# Patient Record
Sex: Female | Born: 2005 | Race: White | Hispanic: No | Marital: Single | Smoking: Never smoker
Health system: Southern US, Community
[De-identification: ages and names within clinical notes are randomized; demographics above are authoritative.]

## PROBLEM LIST (undated history)

## (undated) DIAGNOSIS — T7840XA Allergy, unspecified, initial encounter: Secondary | ICD-10-CM

## (undated) DIAGNOSIS — L709 Acne, unspecified: Secondary | ICD-10-CM

## (undated) DIAGNOSIS — F419 Anxiety disorder, unspecified: Secondary | ICD-10-CM

## (undated) DIAGNOSIS — I10 Essential (primary) hypertension: Secondary | ICD-10-CM

## (undated) HISTORY — DX: Essential (primary) hypertension: I10

## (undated) HISTORY — DX: Acne, unspecified: L70.9

## (undated) HISTORY — DX: Allergy, unspecified, initial encounter: T78.40XA

## (undated) HISTORY — PX: TONSILLECTOMY: SUR1361

## (undated) HISTORY — DX: Anxiety disorder, unspecified: F41.9

---
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
## 2024-01-26 ENCOUNTER — Other Ambulatory Visit: Payer: Self-pay | Admitting: Obstetrics and Gynecology

## 2024-01-26 DIAGNOSIS — Z3041 Encounter for surveillance of contraceptive pills: Secondary | ICD-10-CM

## 2024-01-30 ENCOUNTER — Other Ambulatory Visit: Payer: Self-pay

## 2024-01-30 MED ORDER — DROSPIRENONE-ETHINYL ESTRADIOL 3-0.02 MG PO TABS
1.0000 | ORAL_TABLET | Freq: Every day | ORAL | 0 refills | Status: DC
Start: 2024-01-30 — End: 2024-02-24

## 2024-01-30 NOTE — Telephone Encounter (Signed)
 Patient is scheduled for annual 02/24/24. Requesting refill.  Advised will send refill.

## 2024-01-30 NOTE — Addendum Note (Signed)
 Addended by: Kathlene Cote on: 01/30/2024 01:46 PM   Modules accepted: Orders, Level of Service

## 2024-01-30 NOTE — Telephone Encounter (Signed)
 This encounter was created in error - please disregard.

## 2024-01-30 NOTE — Addendum Note (Signed)
 Addended by: Kathlene Cote on: 01/30/2024 01:46 PM   Modules accepted: Orders

## 2024-02-22 NOTE — Progress Notes (Unsigned)
 PCP:  Rica Records, PA-C   No chief complaint on file.    HPI:      [name] is a 18 y.o. G0P0000 whose LMP was No LMP recorded. (Menstrual status: Oral contraceptives)., presents today for her annual examination.  Her menses are absent with cont dosing OCPs. Only has 1 day BTB if misses a pill. No dysmen.   Sex activity: not currently, contraception - OCP (estrogen/progesterone). Septated hymen removed with Dr. Valentino Saxon 7/24. Last Pap: N/A due to age Hx of STDs: none  There is a FH of breast cancer in her PGM, genetic testing not indicated. There is no FH of ovarian cancer. The patient does not do self-breast exams. Pt with RT??? 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 Had stable 6 mo f/u 10/24, repeat due in 6 monhts   Tobacco use: The patient denies current or previous tobacco use. Alcohol use: none No drug use.  Exercise: moderately active  She does get adequate calcium but not Vitamin D in her diet.  Past Medical History:  Diagnosis Date   Acne    Allergies     Past Surgical History:  Procedure Laterality Date   TONSILLECTOMY      Family History  Problem Relation Age of Onset   Breast cancer Paternal Grandmother 9   Ovarian cancer Neg Hx     Social History   Socioeconomic History   Marital status: Single    Spouse name: Not on file   Number of children: Not on file   Years of education: Not on file   Highest education level: Not on file  Occupational History   Not on file  Tobacco Use   Smoking status: Never   Smokeless tobacco: Never  Vaping Use   Vaping status: Never Used  Substance and Sexual Activity   Alcohol use: Never   Drug use: Never   Sexual activity: Not Currently    Birth control/protection: Pill  Other Topics Concern   Not on file  Social History Narrative   Not on file   Social Drivers of Health   Financial Resource Strain: Not on file  Food Insecurity: Not on file  Transportation Needs: Not on  file  Physical Activity: Not on file  Stress: Not on file  Social Connections: Not on file  Intimate Partner Violence: Not on file     Current Outpatient Medications:    ALPRAZolam (XANAX) 0.5 MG tablet, Take 1 tablet (0.5 mg total) by mouth at bedtime as needed for anxiety. Take 1 tablet 30 minutes prior to procedure.  If still symptomatic, take second dose at time of procedure., Disp: 2 tablet, Rfl: 0   ASTEPRO 205.5 MCG/SPRAY SOLN, USE 2 SPRAYS IN EACH NOSTRIL TWICE DAILY AS NEEDED FOR ALLERGIES, Disp: , Rfl:    cetirizine (ZYRTEC) 10 MG tablet, Take 10 mg by mouth daily., Disp: , Rfl:    drospirenone-ethinyl estradiol (YAZ) 3-0.02 MG tablet, Take 1 tablet by mouth daily. CONTINUOUS DOSING, Disp: 28 tablet, Rfl: 0   fluticasone (FLONASE) 50 MCG/ACT nasal spray, Place 2 sprays into both nostrils daily., Disp: , Rfl:      ROS:  Review of Systems  Constitutional:  Negative for fatigue, fever and unexpected weight change.  Respiratory:  Negative for cough, shortness of breath and wheezing.   Cardiovascular:  Negative for chest pain, palpitations and leg swelling.  Gastrointestinal:  Negative for blood in stool, constipation, diarrhea, nausea and vomiting.  Endocrine:  Negative for cold intolerance, heat intolerance and polyuria.  Genitourinary:  Negative for dyspareunia, dysuria, flank pain, frequency, genital sores, hematuria, menstrual problem, pelvic pain, urgency, vaginal bleeding, vaginal discharge and vaginal pain.  Musculoskeletal:  Negative for back pain, joint swelling and myalgias.  Skin:  Negative for rash.  Neurological:  Negative for dizziness, syncope, light-headedness, numbness and headaches.  Hematological:  Negative for adenopathy.  Psychiatric/Behavioral:  Negative for agitation, confusion, sleep disturbance and suicidal ideas. The patient is not nervous/anxious.    BREAST: No symptoms   Objective: There were no vitals taken for this visit.   Physical  Exam Constitutional:      Appearance: She is well-developed.  Genitourinary:     Vulva normal.     Right Labia: No rash, tenderness or lesions.    Left Labia: No tenderness, lesions or rash.    No vaginal discharge, erythema or tenderness.      Right Adnexa: not tender and no mass present.    Left Adnexa: not tender and no mass present.    No cervical friability or polyp.     Uterus is not enlarged or tender.  Breasts:    Right: Mass present. No nipple discharge, skin change or tenderness.     Left: No mass, nipple discharge, skin change or tenderness.  Neck:     Thyroid: No thyromegaly.  Cardiovascular:     Rate and Rhythm: Normal rate and regular rhythm.     Heart sounds: Normal heart sounds. No murmur heard. Pulmonary:     Effort: Pulmonary effort is normal.     Breath sounds: Normal breath sounds.  Chest:    Abdominal:     Palpations: Abdomen is soft.     Tenderness: There is no abdominal tenderness. There is no guarding or rebound.  Musculoskeletal:        General: Normal range of motion.     Cervical back: Normal range of motion.  Lymphadenopathy:     Cervical: No cervical adenopathy.  Neurological:     General: No focal deficit present.     Mental Status: She is alert and oriented to person, place, and time.     Cranial Nerves: No cranial nerve deficit.  Skin:    General: Skin is warm and dry.  Psychiatric:        Mood and Affect: Mood normal.        Behavior: Behavior normal.        Thought Content: Thought content normal.        Judgment: Judgment normal.  Vitals reviewed.     Assessment/Plan: Encounter for annual routine gynecological examination  Screening for STD (sexually transmitted disease) - Plan: Cervicovaginal ancillary only  Encounter for surveillance of contraceptive pills - Plan: drospirenone-ethinyl estradiol (YAZ) 3-0.02 MG tablet; OCP RF  Mass of upper inner quadrant of right breast - Plan: US BREAST LTD UNI RIGHT INC AXILLA; mass on  exam, pt to call for breast u/s. Will f/u with results.   No orders of the defined types were placed in this encounter.            GYN counsel adequate intake of calcium and vitamin D, diet and exercise     F/U  No follow-ups on file.  Dalia Jollie B. Senai Ramnath, PA-C 02/22/2024 2:59 PM

## 2024-02-24 ENCOUNTER — Encounter: Payer: Self-pay | Admitting: Obstetrics and Gynecology

## 2024-02-24 ENCOUNTER — Ambulatory Visit (INDEPENDENT_AMBULATORY_CARE_PROVIDER_SITE_OTHER): Payer: Medicaid Other | Admitting: Obstetrics and Gynecology

## 2024-02-24 VITALS — BP 98/74 | Ht 63.0 in | Wt 151.0 lb

## 2024-02-24 DIAGNOSIS — Z01419 Encounter for gynecological examination (general) (routine) without abnormal findings: Secondary | ICD-10-CM | POA: Diagnosis not present

## 2024-02-24 DIAGNOSIS — Z3041 Encounter for surveillance of contraceptive pills: Secondary | ICD-10-CM

## 2024-02-24 DIAGNOSIS — N6312 Unspecified lump in the right breast, upper inner quadrant: Secondary | ICD-10-CM

## 2024-02-24 DIAGNOSIS — Z113 Encounter for screening for infections with a predominantly sexual mode of transmission: Secondary | ICD-10-CM

## 2024-02-24 MED ORDER — DROSPIRENONE-ETHINYL ESTRADIOL 3-0.02 MG PO TABS
1.0000 | ORAL_TABLET | Freq: Every day | ORAL | 4 refills | Status: AC
Start: 2024-02-24 — End: ?

## 2024-02-24 NOTE — Patient Instructions (Addendum)
 I value your feedback and you entrusting [place] with your care. If you get a [facility] patient survey, I would appreciate you taking the time to let us know about your experience today. Thank you!  [facility] ([place])--[phone]

## 2024-02-26 DIAGNOSIS — J309 Allergic rhinitis, unspecified: Secondary | ICD-10-CM | POA: Diagnosis not present

## 2024-07-29 DIAGNOSIS — R070 Pain in throat: Secondary | ICD-10-CM | POA: Diagnosis not present

## 2024-09-16 DIAGNOSIS — R1030 Lower abdominal pain, unspecified: Secondary | ICD-10-CM | POA: Diagnosis not present

## 2024-09-20 ENCOUNTER — Inpatient Hospital Stay: Admission: RE | Admit: 2024-09-20 | Source: Ambulatory Visit

## 2024-09-21 ENCOUNTER — Other Ambulatory Visit

## 2024-11-19 ENCOUNTER — Ambulatory Visit
Admission: RE | Admit: 2024-11-19 | Discharge: 2024-11-19 | Disposition: A | Source: Ambulatory Visit | Attending: Obstetrics and Gynecology

## 2024-11-19 DIAGNOSIS — R928 Other abnormal and inconclusive findings on diagnostic imaging of breast: Secondary | ICD-10-CM | POA: Diagnosis not present

## 2024-11-19 DIAGNOSIS — N6312 Unspecified lump in the right breast, upper inner quadrant: Secondary | ICD-10-CM | POA: Diagnosis not present

## 2024-11-22 ENCOUNTER — Ambulatory Visit: Payer: Self-pay | Admitting: Obstetrics and Gynecology

## 2024-11-24 DIAGNOSIS — R0981 Nasal congestion: Secondary | ICD-10-CM | POA: Diagnosis not present

## 2024-11-24 DIAGNOSIS — J309 Allergic rhinitis, unspecified: Secondary | ICD-10-CM | POA: Diagnosis not present

## 2024-12-15 ENCOUNTER — Ambulatory Visit

## 2024-12-16 ENCOUNTER — Ambulatory Visit (INDEPENDENT_AMBULATORY_CARE_PROVIDER_SITE_OTHER): Admitting: Internal Medicine

## 2024-12-16 ENCOUNTER — Ambulatory Visit

## 2024-12-16 ENCOUNTER — Encounter: Payer: Self-pay | Admitting: Internal Medicine

## 2024-12-16 ENCOUNTER — Other Ambulatory Visit (HOSPITAL_COMMUNITY): Payer: Self-pay

## 2024-12-16 ENCOUNTER — Telehealth: Payer: Self-pay

## 2024-12-16 VITALS — BP 130/80 | Ht 63.0 in | Wt 164.6 lb

## 2024-12-16 DIAGNOSIS — E663 Overweight: Secondary | ICD-10-CM | POA: Diagnosis not present

## 2024-12-16 DIAGNOSIS — F411 Generalized anxiety disorder: Secondary | ICD-10-CM | POA: Diagnosis not present

## 2024-12-16 DIAGNOSIS — G44221 Chronic tension-type headache, intractable: Secondary | ICD-10-CM | POA: Insufficient documentation

## 2024-12-16 DIAGNOSIS — Z6829 Body mass index (BMI) 29.0-29.9, adult: Secondary | ICD-10-CM | POA: Insufficient documentation

## 2024-12-16 DIAGNOSIS — Z9109 Other allergy status, other than to drugs and biological substances: Secondary | ICD-10-CM | POA: Insufficient documentation

## 2024-12-16 MED ORDER — ESCITALOPRAM OXALATE 5 MG PO TABS: 5.0000 mg | ORAL_TABLET | Freq: Every day | ORAL | 3 refills | Status: AC

## 2024-12-16 MED ORDER — BUSPIRONE HCL 7.5 MG PO TABS: 7.5000 mg | ORAL_TABLET | Freq: Two times a day (BID) | ORAL | 3 refills | Status: AC | PRN

## 2024-12-16 MED ORDER — CYCLOBENZAPRINE HCL 5 MG PO TABS: 5.0000 mg | ORAL_TABLET | Freq: Every day | ORAL | 1 refills | Status: AC | PRN

## 2024-12-16 NOTE — Assessment & Plan Note (Signed)
 Encouraged diet and exercise for weight

## 2024-12-16 NOTE — Assessment & Plan Note (Signed)
 Encouraged stress reduction techniques Advised her to try excedrin migraine OTC Rx for cyclobenzaprine  5 mg daily as needed-sedation caution given

## 2024-12-16 NOTE — Progress Notes (Signed)
 "  Subjective:    Patient ID: Emily Bond, female    DOB: 31-Mar-2006, 19 y.o.   MRN: [id number]  HPI  Patient presents to clinic today to establish care and for management of the conditions listed below.  Tension headaches: She reports these were occurring frequently leading to an intense episode in December.  She reports they have improved slightly.  They are occurring every few weeks now.  She feels like they might be triggered by stress or anxiety.  The pain starts at the base of her skull and extends around wrapping around her head to the top of her head.  She describes the pain as throbbing.  She denies dizziness, vision changes, sensitivity to sound, nausea or vomiting but she does have sensitivity to light.  She recently had her eyes checked, was told that she had astigmatism and needs to wear glasses only at night.  She has tried ibuprofen and tylenol OTC with minimal relief of symptoms.  Environmental allergies: Managed with cetirizine, Astepro and fluticasone.  She has seen an allergist in the past and underwent allergy testing.  It was recommended that she take allergy shots however she moved away to college and was not able to get this done.  Anxious: She feels like this has been an issue her whole life.  She has noticed that it seems worse when driving, especially at night.  She did recently see an eye doctor who told her that she had a bilateral astigmatism and gave her glasses to wear at night.  She has never had a car accident before.  She noticed that she is always chewing her nails.  She has never been medicated for this or seen a therapist in the past.  She denies depression, SI/HI.  Review of Systems   Past Medical History:  Diagnosis Date   Acne    Allergies     Current Outpatient Medications  Medication Sig Dispense Refill   ASTEPRO 205.5 MCG/SPRAY SOLN USE 2 SPRAYS IN EACH NOSTRIL TWICE DAILY AS NEEDED FOR ALLERGIES     cetirizine (ZYRTEC) 10 MG tablet Take 10 mg by  mouth daily.     drospirenone -ethinyl estradiol  (YAZ) 3-0.02 MG tablet Take 1 tablet by mouth daily. CONTINUOUS DOSING 84 tablet 4   fluticasone (FLONASE) 50 MCG/ACT nasal spray Place 2 sprays into both nostrils daily.     No current facility-administered medications for this visit.    Allergies[1]  Family History  Problem Relation Age of Onset   Breast cancer Paternal Grandmother 23   Ovarian cancer Neg Hx     Social History   Socioeconomic History   Marital status: Single    Spouse name: Not on file   Number of children: Not on file   Years of education: Not on file   Highest education level: GED or equivalent  Occupational History   Not on file  Tobacco Use   Smoking status: Never   Smokeless tobacco: Never  Vaping Use   Vaping status: Never Used  Substance and Sexual Activity   Alcohol use: Never   Drug use: Never   Sexual activity: Not Currently    Birth control/protection: Pill  Other Topics Concern   Not on file  Social History Narrative   Not on file   Social Drivers of Health   Tobacco Use: Low Risk  (11/24/2024)   Received from [facility]   Patient History    Smoking Tobacco Use: Never    Smokeless  Tobacco Use: Never    Passive Exposure: Not on file  Financial Resource Strain: Low Risk (12/15/2024)   Overall Financial Resource Strain (CARDIA)    Difficulty of Paying Living Expenses: Not very hard  Food Insecurity: No Food Insecurity (12/15/2024)   Epic    Worried About Programme Researcher, Broadcasting/film/video in the Last Year: Never true    Ran Out of Food in the Last Year: Never true  Transportation Needs: No Transportation Needs (12/15/2024)   Epic    Lack of Transportation (Medical): No    Lack of Transportation (Non-Medical): No  Physical Activity: Sufficiently Active (12/15/2024)   Exercise Vital Sign    Days of Exercise per Week: 7 days    Minutes of Exercise per Session: 50 min  Stress: Stress Concern Present (12/15/2024)   [employer] of  Occupational Health - Occupational Stress Questionnaire    Feeling of Stress: Rather much  Social Connections: Moderately Integrated (12/15/2024)   Social Connection and Isolation Panel    Frequency of Communication with Friends and Family: More than three times a week    Frequency of Social Gatherings with Friends and Family: More than three times a week    Attends Religious Services: 1 to 4 times per year    Active Member of [employer] or Organizations: Yes    Attends Banker Meetings: 1 to 4 times per year    Marital Status: Never married  Intimate Partner Violence: Not on file  Depression (EYV7-0): Not on file  Alcohol Screen: Not on file  Housing: Unknown (12/15/2024)   Epic    Unable to Pay for Housing in the Last Year: No    Number of Times Moved in the Last Year: Not on file    Homeless in the Last Year: No  Utilities: Not on file  Health Literacy: Not on file     Constitutional: Patient reports intermittent headaches.  Denies fever, malaise, fatigue, or abrupt weight changes.  HEENT: Denies eye pain, eye redness, ear pain, ringing in the ears, wax buildup, runny nose, nasal congestion, bloody nose, or sore throat. Respiratory: Denies difficulty breathing, shortness of breath, cough or sputum production.   Cardiovascular: Denies chest pain, chest tightness, palpitations or swelling in the hands or feet.  Gastrointestinal: Denies abdominal pain, bloating, constipation, diarrhea or blood in the stool.  GU: Denies urgency, frequency, pain with urination, burning sensation, blood in urine, odor or discharge. Musculoskeletal: Denies decrease in range of motion, difficulty with gait, muscle pain or joint pain and swelling.  Skin: Denies redness, rashes, lesions or ulcercations.  Neurological: Denies dizziness, difficulty with memory, difficulty with speech or problems with balance and coordination.  Psych: Patient reports anxiety.  Denies depression, SI/HI.  No other specific  complaints in a complete review of systems (except as listed in HPI above).      Objective:   Physical Exam  BP 130/80 (BP Location: Right Arm, Patient Position: Sitting, Cuff Size: Normal)   Ht 5' 3 (1.6 m)   Wt 164 lb 9.6 oz (74.7 kg)   LMP 11/19/2024 (Approximate)   BMI 29.16 kg/m   Wt Readings from Last 3 Encounters:  02/24/24 151 lb (68.5 kg) (84%, Z= 0.99)*  06/09/23 144 lb 3.2 oz (65.4 kg) (80%, Z= 0.84)*  05/28/23 146 lb 4.8 oz (66.4 kg) (82%, Z= 0.91)*   * Growth percentiles are based on CDC (Girls, 2-20 Years) data.    General: Appears her stated age, overweight, in NAD. Skin: Warm, dry  and intact. No rashes, lesions or ulcerations noted. HEENT: Head: normal shape and size; Eyes: sclera white, no icterus, conjunctiva pink, PERRLA and EOMs intact; Cardiovascular: Normal rate and rhythm. S1,S2 noted.  No murmur, rubs or gallops noted.  Pulmonary/Chest: Normal effort and positive vesicular breath sounds. No respiratory distress. No wheezes, rales or ronchi noted.  Musculoskeletal:  No difficulty with gait.  Neurological: Alert and oriented. Coordination normal.  Psychiatric: Mood and affect normal.  Mildly anxious appearing. Judgment and thought content normal.         Assessment & Plan:    RTC in 1 year for your annual exam Angeline Laura, NP      [1] No Known Allergies  "

## 2024-12-16 NOTE — Telephone Encounter (Signed)
 Pharmacy Patient Advocate Encounter   Received notification from [facility] that prior authorization for Buspirone  7.5 tabs is required/requested.   Insurance verification completed.   The patient is insured through [employer].   Per test claim: The current 30 day co-pay is, $0.00.  No PA needed at this time. This test claim was processed through [facility]- copay amounts may vary at other pharmacies due to pharmacy/plan contracts, or as the patient moves through the different stages of their insurance plan.

## 2024-12-16 NOTE — Telephone Encounter (Signed)
 Pharmacy Patient Advocate Encounter   Received notification from [facility] that prior authorization for Escitalopram  Oxalate 5 tabs is required/requested.   Insurance verification completed.   The patient is insured through [employer].   Per test claim: The current 30 day co-pay is, $0.00.  No PA needed at this time. This test claim was processed through [facility]- copay amounts may vary at other pharmacies due to pharmacy/plan contracts, or as the patient moves through the different stages of their insurance plan.

## 2024-12-16 NOTE — Assessment & Plan Note (Signed)
 Continue cetirizine 10 mg daily, fluticasone and astepro as needed She will continue to follow with her allergist

## 2024-12-16 NOTE — Assessment & Plan Note (Signed)
 Will trial escitalopram  5 mg good she has as needed Will also trial buspirone  7.5 mg twice daily as needed as needed for breakthrough She is not interested in seeing a therapist at this time Support offered

## 2024-12-16 NOTE — Patient Instructions (Signed)
 Tension Headache, Adult A tension headache is a feeling of pain, pressure, or aching in the head. It is often felt over the front and sides of the head. Tension headaches can last from 30 minutes to several days. What are the causes? The cause of this condition is not known. Sometimes, tension headaches are brought on by stress, worry (anxiety), or depression. Other things that may set them off include: Alcohol. Too much caffeine or caffeine withdrawal. Colds, flu, or sinus infections. Dental problems. This can include clenching your teeth. Being tired. Holding your head and neck in the same position for a long time, such as while using a computer. Smoking. Arthritis in the neck. What are the signs or symptoms? Feeling pressure around the head. A dull ache in the head. Pain over the front and sides of the head. Feeling sore or tender in the muscles of the head, neck, and shoulders. How is this treated? This condition may be treated with lifestyle changes and with medicines that help relieve symptoms. Follow these instructions at home: Managing pain Take over-the-counter and prescription medicines only as told by your doctor. When you have a headache, lie down in a dark, quiet room. If told, put ice on your head and neck. To do this: Put ice in a plastic bag. Place a towel between your skin and the bag. Leave the ice on for 20 minutes, 2-3 times a day. Take off the ice if your skin turns bright red. This is very important. If you cannot feel pain, heat, or cold, you have a greater risk of damage to the area. If told, put heat on the back of your neck. Do this as often as told by your doctor. Use the heat source that your doctor recommends, such as a moist heat pack or a heating pad. Place a towel between your skin and the heat source. Leave the heat on for 20-30 minutes. Take off the heat if your skin turns bright red. This is very important. If you cannot feel pain, heat, or cold, you  have a greater risk of getting burned. Eating and drinking Eat meals on a regular schedule. If you drink alcohol: Limit how much you have to: 0-1 drink a day for women who are not pregnant. 0-2 drinks a day for men. Know how much alcohol is in your drink. In the [place] one drink equals one 12 oz bottle of beer (355 mL), one 5 oz glass of wine (148 mL), or one 1 oz glass of hard liquor (44 mL). Drink enough fluid to keep your pee (urine) pale yellow. Do not use a lot of caffeine, or stop using caffeine. Lifestyle Get 7-9 hours of sleep each night. Or get the amount of sleep that your doctor tells you to. At bedtime, keep computers, phones, and tablets out of your room. Find ways to lessen your stress. This may include: Exercise. Deep breathing. Yoga. Listening to music. Thinking positive thoughts. Sit up straight. Try to relax your muscles. Do not smoke or use any products that contain nicotine or tobacco. If you need help quitting, ask your doctor. General instructions  Avoid things that can bring on headaches. Keep a headache journal to see what may bring on headaches. For example, write down: What you eat and drink. How much sleep you get. Any change to your diet or medicines. Keep all follow-up visits. Contact a doctor if: Your headache does not get better. Your headache comes back. You have a headache, and sounds,  light, or smells bother you. You feel like you may vomit, or you vomit. Your stomach hurts. Get help right away if: You all of a sudden get a very bad headache with any of these things: A stiff neck. Feeling like you may vomit. Vomiting. Feeling mixed up (confused). Feeling weak in one part or one side of your body. Having trouble seeing or speaking, or both. Feeling short of breath. A rash. Feeling very sleepy. Pain in your eye or ear. Trouble walking or balancing. Feeling like you will faint, or you faint. Summary A tension headache is pain, pressure,  or aching in your head. Tension headaches can last from 30 minutes to several days. Lifestyle changes and medicines may help relieve pain. This information is not intended to replace advice given to you by your health care provider. Make sure you discuss any questions you have with your health care provider. Document Revised: 08/22/2023 Document Reviewed: 08/22/2023 Elsevier Patient Education  2024 [employer].

## 2024-12-22 ENCOUNTER — Other Ambulatory Visit (HOSPITAL_COMMUNITY): Payer: Self-pay

## 2025-12-12 ENCOUNTER — Encounter: Admitting: Internal Medicine
